# Patient Record
Sex: Male | Born: 1985 | Race: White | Hispanic: No | Marital: Single
Health system: Southern US, Community
[De-identification: ages and names within clinical notes are randomized; demographics above are authoritative.]

---

## 2013-09-15 ENCOUNTER — Other Ambulatory Visit: Payer: Self-pay | Admitting: Physician Assistant

## 2013-09-15 ENCOUNTER — Ambulatory Visit
Admission: RE | Admit: 2013-09-15 | Discharge: 2013-09-15 | Disposition: A | Discharge status: Home / Self Care | Payer: 59 | Source: Ambulatory Visit | Attending: Physician Assistant | Admitting: Physician Assistant

## 2013-09-15 DIAGNOSIS — M545 Low back pain, unspecified: Secondary | ICD-10-CM

## 2014-10-19 IMAGING — CR DG LUMBAR SPINE 2-3V
3 series · 3 of 3 positions shown · non-contrast
Comparison: None.

CLINICAL DATA: Increasing low back and left leg pain.

EXAM:
LUMBAR SPINE - 2-3 VIEW

[view not recorded (1 of 3)]
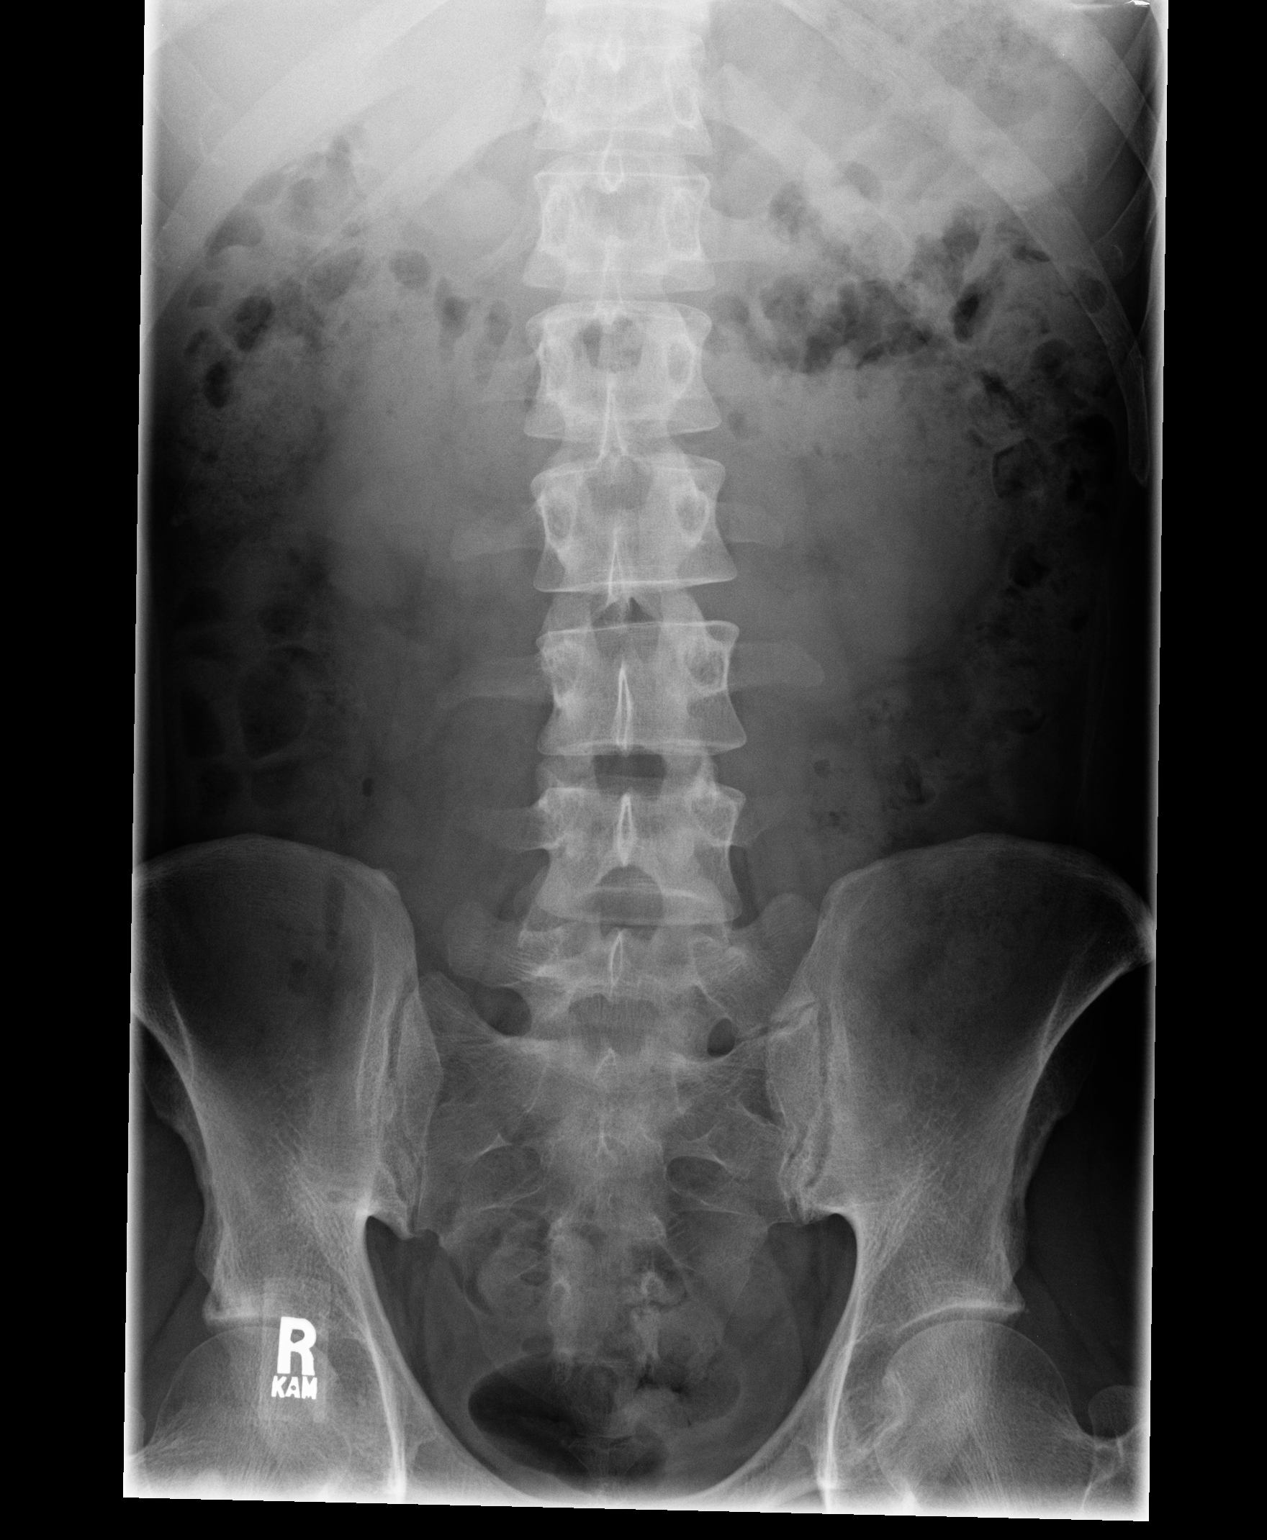

[view not recorded (2 of 3)]
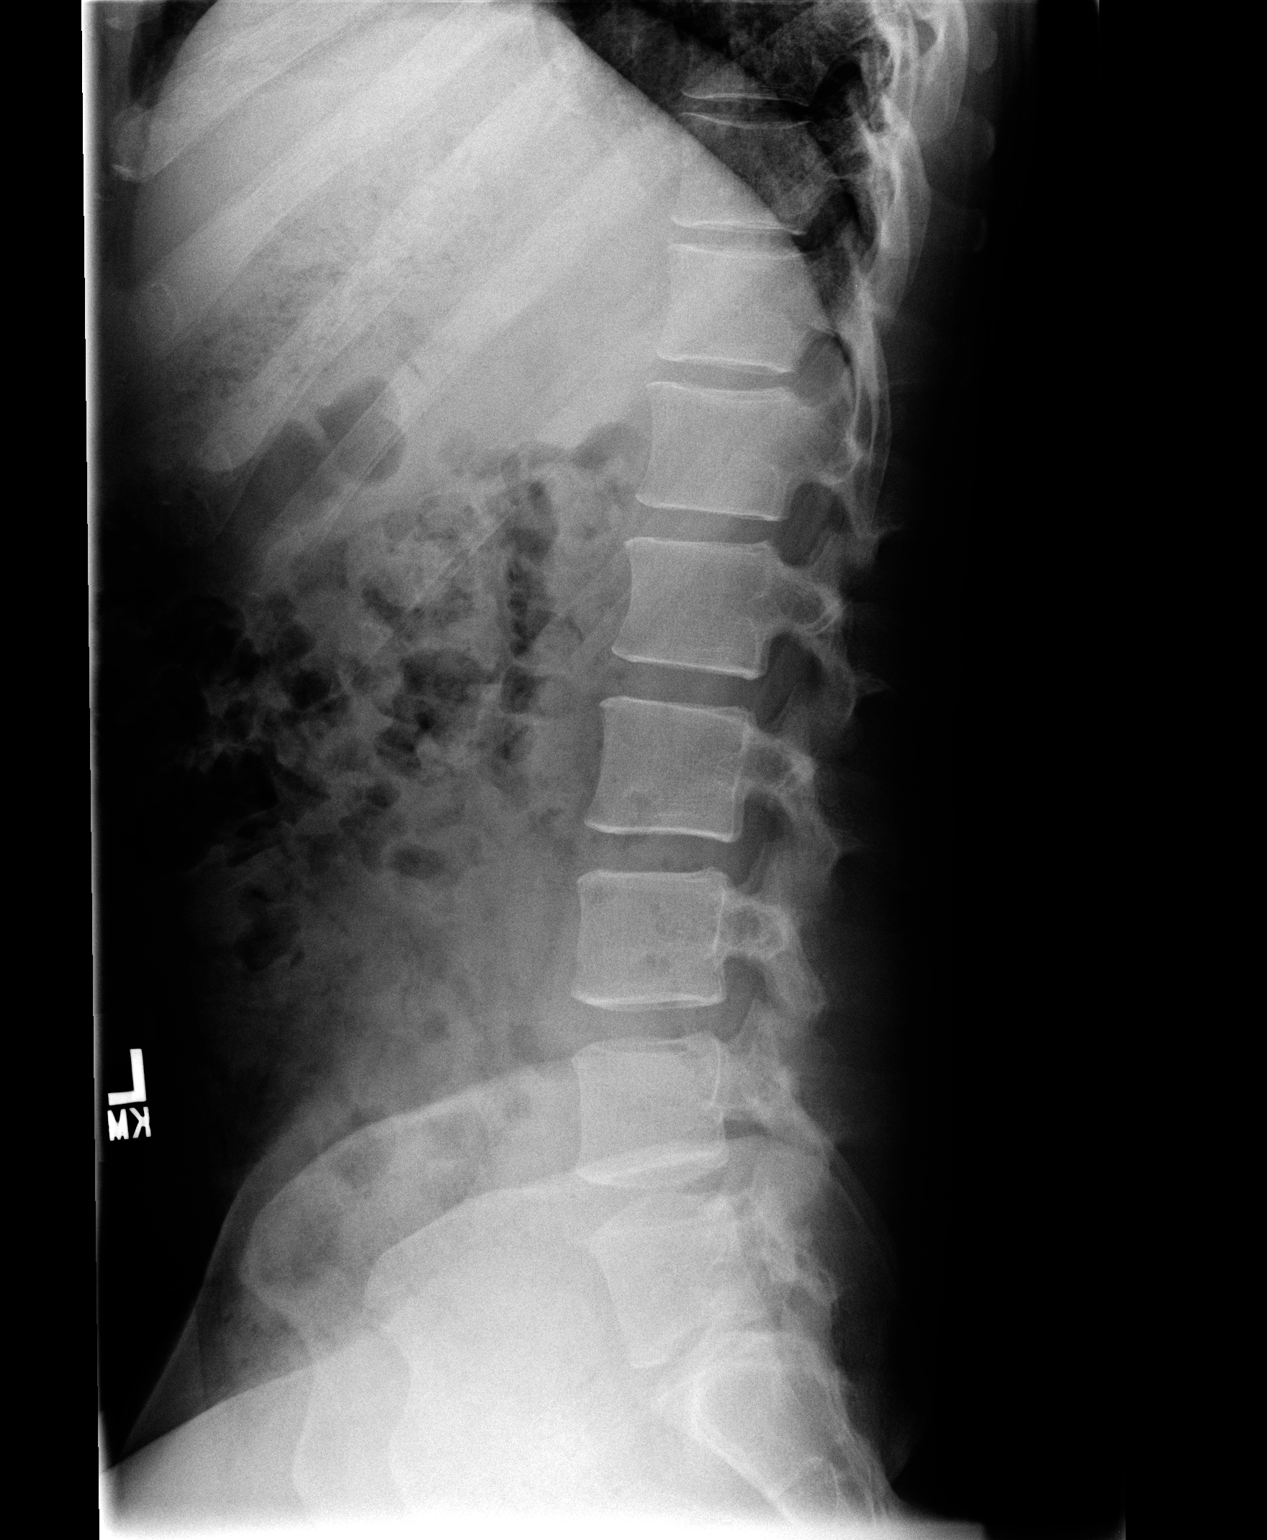

[view not recorded (3 of 3)]
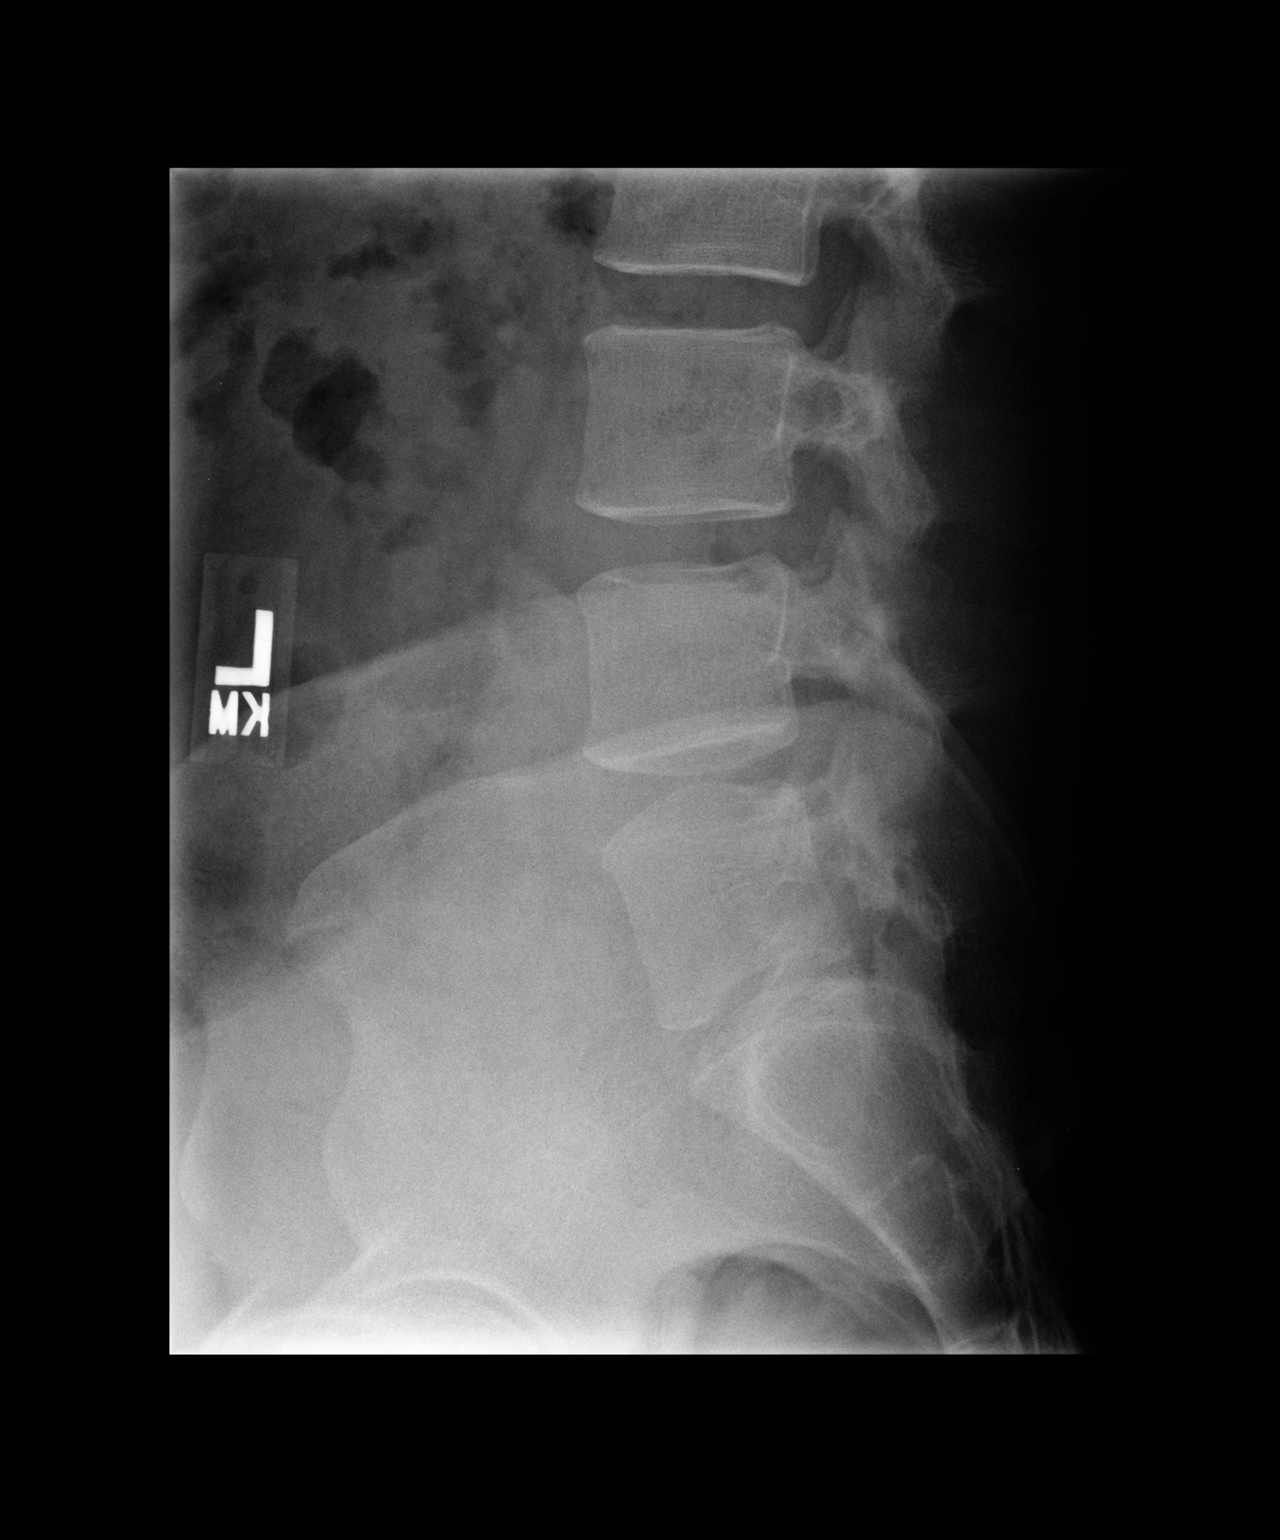

[3 of 3 positions shown; findings below may reference images not displayed]

FINDINGS: S1 appears transitional. Alignment is anatomic. Vertebral body and
disc space height are maintained.
IMPRESSION: No findings to explain the patient's pain.
